# Patient Record
Sex: Male | Born: 1990 | Race: Black or African American | Hispanic: No | Marital: Single | Smoking: Never smoker
Health system: Southern US, Community
[De-identification: ages and names within clinical notes are randomized; demographics above are authoritative.]

## PROBLEM LIST (undated history)

## (undated) HISTORY — PX: TOE SURGERY: SHX1073

---

## 2011-05-27 ENCOUNTER — Emergency Department (HOSPITAL_COMMUNITY)
Admission: EM | Admit: 2011-05-27 | Discharge: 2011-05-27 | Disposition: A | Discharge status: Home / Self Care | Payer: No Typology Code available for payment source | Attending: Emergency Medicine | Admitting: Emergency Medicine

## 2011-05-27 ENCOUNTER — Encounter (HOSPITAL_COMMUNITY): Payer: Self-pay | Admitting: Neurology

## 2011-05-27 ENCOUNTER — Emergency Department (HOSPITAL_COMMUNITY): Payer: No Typology Code available for payment source

## 2011-05-27 DIAGNOSIS — R209 Unspecified disturbances of skin sensation: Secondary | ICD-10-CM | POA: Insufficient documentation

## 2011-05-27 DIAGNOSIS — M542 Cervicalgia: Secondary | ICD-10-CM | POA: Insufficient documentation

## 2011-05-27 DIAGNOSIS — S39012A Strain of muscle, fascia and tendon of lower back, initial encounter: Secondary | ICD-10-CM

## 2011-05-27 DIAGNOSIS — S339XXA Sprain of unspecified parts of lumbar spine and pelvis, initial encounter: Secondary | ICD-10-CM | POA: Insufficient documentation

## 2011-05-27 DIAGNOSIS — M79609 Pain in unspecified limb: Secondary | ICD-10-CM | POA: Insufficient documentation

## 2011-05-27 DIAGNOSIS — Y92410 Unspecified street and highway as the place of occurrence of the external cause: Secondary | ICD-10-CM | POA: Insufficient documentation

## 2011-05-27 LAB — URINALYSIS, ROUTINE W REFLEX MICROSCOPIC
Bilirubin Urine: NEGATIVE
Hgb urine dipstick: NEGATIVE
Ketones, ur: NEGATIVE mg/dL
Specific Gravity, Urine: 1.025 (ref 1.005–1.030)
Urobilinogen, UA: 1 mg/dL (ref 0.0–1.0)

## 2011-05-27 LAB — POCT I-STAT, CHEM 8
BUN: 18 mg/dL (ref 6–23)
Calcium, Ion: 1.2 mmol/L (ref 1.12–1.32)
Chloride: 106 mEq/L (ref 96–112)
Creatinine, Ser: 0.8 mg/dL (ref 0.50–1.35)

## 2011-05-27 LAB — DIFFERENTIAL
Basophils Absolute: 0 10*3/uL (ref 0.0–0.1)
Basophils Relative: 0 % (ref 0–1)
Eosinophils Absolute: 0.1 10*3/uL (ref 0.0–0.7)
Eosinophils Relative: 2 % (ref 0–5)
Monocytes Absolute: 0.3 10*3/uL (ref 0.1–1.0)
Monocytes Relative: 6 % (ref 3–12)
Neutro Abs: 1.8 10*3/uL (ref 1.7–7.7)

## 2011-05-27 LAB — CBC
HCT: 45.2 % (ref 39.0–52.0)
Hemoglobin: 15.7 g/dL (ref 13.0–17.0)
MCH: 29 pg (ref 26.0–34.0)
MCHC: 34.7 g/dL (ref 30.0–36.0)
MCV: 83.5 fL (ref 78.0–100.0)
RDW: 12.8 % (ref 11.5–15.5)

## 2011-05-27 LAB — SAMPLE TO BLOOD BANK

## 2011-05-27 MED ORDER — CYCLOBENZAPRINE HCL 10 MG PO TABS
10.0000 mg | ORAL_TABLET | Freq: Once | ORAL | Status: DC
  Filled 2011-05-27: qty 1

## 2011-05-27 MED ORDER — CYCLOBENZAPRINE HCL 10 MG PO TABS: 5.0000 mg | ORAL_TABLET | Freq: Once | ORAL | Status: DC

## 2011-05-27 MED ORDER — HYDROCODONE-ACETAMINOPHEN 5-325 MG PO TABS
1.0000 | ORAL_TABLET | Freq: Once | ORAL | Status: DC
  Filled 2011-05-27: qty 1

## 2011-05-27 MED ORDER — HYDROCODONE-ACETAMINOPHEN 5-325 MG PO TABS: 1.0000 | ORAL_TABLET | Freq: Once | ORAL | Status: DC

## 2011-05-27 MED ORDER — HYDROCODONE-ACETAMINOPHEN 5-325 MG PO TABS: 1.0000 | ORAL_TABLET | ORAL | Status: AC | PRN

## 2011-05-27 MED ORDER — CYCLOBENZAPRINE HCL 10 MG PO TABS: 10.0000 mg | ORAL_TABLET | Freq: Three times a day (TID) | ORAL | Status: AC | PRN
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
## 2011-05-27 NOTE — Progress Notes (Signed)
Orthopedic Tech Progress Note Patient Details:  Luke Cruz [birth year] [id number]  Patient ID: [name], male   DOB: 02-16-1991, 21 y.o.   MRN: [id number] Made trauma visit  [name] 05/27/2011, 6:57 PM
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
## 2013-08-30 ENCOUNTER — Encounter (HOSPITAL_COMMUNITY): Payer: Self-pay | Admitting: Emergency Medicine

## 2013-08-30 ENCOUNTER — Emergency Department (INDEPENDENT_AMBULATORY_CARE_PROVIDER_SITE_OTHER)
Admission: EM | Admit: 2013-08-30 | Discharge: 2013-08-30 | Disposition: A | Discharge status: Home / Self Care | Payer: Self-pay | Source: Home / Self Care | Attending: Family Medicine | Admitting: Family Medicine

## 2013-08-30 DIAGNOSIS — N410 Acute prostatitis: Secondary | ICD-10-CM

## 2013-08-30 LAB — POCT URINALYSIS DIP (DEVICE)
Bilirubin Urine: NEGATIVE
Glucose, UA: NEGATIVE mg/dL
Hgb urine dipstick: NEGATIVE
KETONES UR: NEGATIVE mg/dL
LEUKOCYTES UA: NEGATIVE
NITRITE: NEGATIVE
PROTEIN: NEGATIVE mg/dL
Specific Gravity, Urine: 1.02 (ref 1.005–1.030)
Urobilinogen, UA: 0.2 mg/dL (ref 0.0–1.0)
pH: 8.5 — ABNORMAL HIGH (ref 5.0–8.0)

## 2013-08-30 MED ORDER — TAMSULOSIN HCL 0.4 MG PO CAPS: 0.4000 mg | ORAL_CAPSULE | Freq: Every day | ORAL | Status: AC

## 2013-08-30 MED ORDER — DOXYCYCLINE HYCLATE 100 MG PO CAPS: 100.0000 mg | ORAL_CAPSULE | Freq: Two times a day (BID) | ORAL | Status: AC

## 2013-08-30 NOTE — Discharge Instructions (Signed)
Take all of medicine and see urologist if further problems.

## 2013-08-30 NOTE — ED Notes (Signed)
Patient c/o urinary frequency x 2 weeks. Patient reports he has lower back pain as well. Denies fever or penile discharge.

## 2013-08-30 NOTE — ED Provider Notes (Signed)
CSN: [id number]     Arrival date & time 08/30/13  1948 History   First MD Initiated Contact with Patient 08/30/13 2044     Chief Complaint  Patient presents with  . Urinary Frequency   (Consider location/radiation/quality/duration/timing/severity/associated sxs/prior Treatment) Patient is a 23 y.o. male presenting with frequency. The history is provided by the patient.  Urinary Frequency This is a new problem. The current episode started more than 1 week ago (2 wks of sx.). The problem has not changed since onset.Associated symptoms include abdominal pain.    History reviewed. No pertinent past medical history. Past Surgical History  Procedure Laterality Date  . Toe surgery     No family history on file. History  Substance Use Topics  . Smoking status: Never Smoker   . Smokeless tobacco: Not on file  . Alcohol Use: Yes    Review of Systems  Constitutional: Negative for fever and chills.  Gastrointestinal: Positive for abdominal pain. Negative for nausea, vomiting and diarrhea.  Genitourinary: Positive for urgency, frequency and difficulty urinating. Negative for dysuria, discharge, penile swelling, scrotal swelling, penile pain and testicular pain.    Allergies  Review of patient's allergies indicates no known allergies.  Home Medications   Prior to Admission medications   Medication Sig Start Date End Date Taking? Authorizing Provider  doxycycline (VIBRAMYCIN) 100 MG capsule Take 1 capsule (100 mg total) by mouth 2 (two) times daily. 08/30/13   Linna HoffJames D Kindl, MD  tamsulosin (FLOMAX) 0.4 MG CAPS capsule Take 1 capsule (0.4 mg total) by mouth daily. 08/30/13   Linna HoffJames D Kindl, MD   BP 123/76  Pulse 56  Temp(Src) 98.3 F (36.8 C) (Oral)  Resp 14  SpO2 100% Physical Exam  Nursing note and vitals reviewed. Constitutional: He is oriented to person, place, and time. He appears well-developed and well-nourished.  Abdominal: Soft. Bowel sounds are normal. He exhibits no  distension and no mass. There is no tenderness. There is no rebound and no guarding.  Genitourinary: Prostate normal and penis normal.  Neurological: He is alert and oriented to person, place, and time.  Skin: Skin is warm and dry.    ED Course  Procedures (including critical care time) Labs Review Labs Reviewed  POCT URINALYSIS DIP (DEVICE) - Abnormal; Notable for the following:    pH 8.5 (*)    All other components within normal limits    Imaging Review No results found.   MDM   1. Acute prostatitis        Linna HoffJames D Kindl, MD 08/30/13 2118
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
